# Patient Record
Sex: Male | Born: 1960 | Race: White | Hispanic: No | Marital: Married | Smoking: Never smoker
Health system: Southern US, Community
[De-identification: ages and names within clinical notes are randomized; demographics above are authoritative.]

## PROBLEM LIST (undated history)

## (undated) HISTORY — PX: BACK SURGERY: SHX140

---

## 2018-08-11 ENCOUNTER — Institutional Professional Consult (permissible substitution): Payer: Self-pay | Admitting: Pulmonary Disease

## 2018-08-31 ENCOUNTER — Ambulatory Visit (INDEPENDENT_AMBULATORY_CARE_PROVIDER_SITE_OTHER): Payer: PRIVATE HEALTH INSURANCE | Admitting: Pulmonary Disease

## 2018-08-31 ENCOUNTER — Encounter: Payer: Self-pay | Admitting: Pulmonary Disease

## 2018-08-31 VITALS — BP 120/72 | HR 71 | Ht 72.0 in | Wt 165.0 lb

## 2018-08-31 DIAGNOSIS — G4733 Obstructive sleep apnea (adult) (pediatric): Secondary | ICD-10-CM

## 2018-08-31 NOTE — Patient Instructions (Signed)
Schedule home sleep study.   

## 2018-08-31 NOTE — Assessment & Plan Note (Signed)
Given excessive daytime somnolence, narrow pharyngeal exam, witnessed apneas & loud snoring, obstructive sleep apnea is very likely & an overnight polysomnogram will be scheduled as a home study. The pathophysiology of obstructive sleep apnea , it's cardiovascular consequences & modes of treatment including CPAP were discused with the patient in detail & they evidenced understanding.  Pretest probability is high.  Red flags include witnessed apneas loud snoring, weight gain.  Certainly quality of his sleep appears poor although quantity may be sufficient for him since he is required about 6 hours all his life.  He does not seem to have history suggestive restless leg syndrome.  We will consider treating for AHI more than 5/hour

## 2018-08-31 NOTE — Progress Notes (Signed)
Subjective:    Patient ID: Larry Austin, male    DOB: 07/22/1961, 57 y.o.   MRN: [id number]  HPI  Chief Complaint  Patient presents with  . Sleep Consult    Per patient, he has issues with staying asleep at night. He will fall asleep and stay asleep for 3-4 hours but will wake up and stay up. Had a SS 14-15 years ago, normal results    57 year old man presents for evaluation of sleep disordered breathing. His main complaint is that he feels always tired.  He he admits that he has never slept well in 35 years and he is okay with 4 to 6 hours of sleep.  Wife has noted increased snoring and occasionally witnessed apneas.  Sometimes he is woken himself up by snoring.  But his main complaint again is that he is always tired.  Epworth sleepiness score is 21 and he reports sleepiness while sitting and reading, watching TV, sitting inactive in a public place or as a passenger in a car. Bedtime is between 10 and 11 PM, sleep latency is a few minutes, sleeps on his left side with one pillow related to left shoulder issues, reports 2-3 nocturnal awakenings especially in the early morning, he seems to sleep well for about 4 hours before he has these awakenings, nocturia x1, he is out of bed by 5:15 AM feeling rested initially but then starts feeling tired again in a couple of hours, no dryness of mouth or morning headaches.  He feels that even a 15 to 20-minute nap is energizing.  He is unable to get this on a weekday but on weekends he is able to get 1-2 naps.  There is no history suggestive of cataplexy, sleep paralysis or parasomnias  He had a sleep study about 15 years ago when he lived in [place] and was told that he does not have sleep apnea.  He was given Ambien initially but had a huge hangover effect.  He was then tried on Sonata which did not help this issues. He has gained about 20 pounds over the past few years to his current weight of 240 pounds  He denies creepy crawly sensations on his  legs, his legs do not wake him up from sleep   No past medical history on file. Past surgical history-tonsillectomy  Social-he drinks an occasional beer, no history of smoking or drug abuse, he lives with his spouse, he works as a Nurse, learning disabilityposition engineer for [employer] and spends extended amounts of time on the computer  No family history of OSA or cancer    No past medical history on file.   Review of Systems  Constitutional: Negative for fever and unexpected weight change.  HENT: Negative for congestion, dental problem, ear pain, nosebleeds, postnasal drip, rhinorrhea, sinus pressure, sneezing, sore throat and trouble swallowing.   Eyes: Negative for redness and itching.  Respiratory: Negative for cough, chest tightness, shortness of breath and wheezing.   Cardiovascular: Negative for palpitations and leg swelling.  Gastrointestinal: Negative for nausea and vomiting.  Genitourinary: Negative for dysuria.  Musculoskeletal: Negative for joint swelling.  Skin: Negative for rash.  Allergic/Immunologic: Negative.  Negative for environmental allergies, food allergies and immunocompromised state.  Neurological: Negative for headaches.  Hematological: Does not bruise/bleed easily.  Psychiatric/Behavioral: Negative for dysphoric mood. The patient is not nervous/anxious.        Objective:   Physical Exam  Gen. Pleasant, obese, in no distress, normal affect ENT -  underbite, no  post nasal drip, class 2-3 airway Neck: No JVD, no thyromegaly, no carotid bruits Lungs: no use of accessory muscles, no dullness to percussion, decreased without rales or rhonchi  Cardiovascular: Rhythm regular, heart sounds  normal, no murmurs or gallops, no peripheral edema Abdomen: soft and non-tender, no hepatosplenomegaly, BS normal. Musculoskeletal: No deformities, no cyanosis or clubbing Neuro:  alert, non focal, no tremors       Assessment & Plan:

## 2018-10-18 DIAGNOSIS — G4733 Obstructive sleep apnea (adult) (pediatric): Secondary | ICD-10-CM | POA: Diagnosis not present

## 2018-10-29 DIAGNOSIS — G4733 Obstructive sleep apnea (adult) (pediatric): Secondary | ICD-10-CM | POA: Diagnosis not present

## 2018-11-03 ENCOUNTER — Telehealth: Payer: Self-pay | Admitting: Pulmonary Disease

## 2018-11-03 DIAGNOSIS — G4733 Obstructive sleep apnea (adult) (pediatric): Secondary | ICD-10-CM

## 2018-11-03 NOTE — Telephone Encounter (Signed)
Per RA, HST showed moderate OSA with 16 events per hour. Recommends RX of autocpap 5-15cm, mask of choice. OV with NP in 6 weeks.

## 2018-11-05 NOTE — Telephone Encounter (Signed)
Patient returning phone call.  Patient phone number is [phone].

## 2018-11-05 NOTE — Telephone Encounter (Signed)
Called and spoke with patient regarding results.  Informed the patient of results and recommendations today. Placed order for new auto cpap machine 5-15cm, mask of choice and supplies Scheduled f/u appt with EW on 01/03/2019 at 9am Pt verbalized understanding and denied any questions or concerns at this time.  Nothing further needed.

## 2018-11-05 NOTE — Telephone Encounter (Signed)
Attempted to call back pt, no answer, no vm available. X1

## 2018-11-05 NOTE — Telephone Encounter (Signed)
Pt returning call requesting results CB# [phone]//kob

## 2018-12-13 ENCOUNTER — Telehealth: Payer: Self-pay | Admitting: Pulmonary Disease

## 2018-12-13 DIAGNOSIS — G4733 Obstructive sleep apnea (adult) (pediatric): Secondary | ICD-10-CM

## 2018-12-13 NOTE — Telephone Encounter (Signed)
I am fine signing this order.  Please go ahead and place it.  [name]

## 2018-12-13 NOTE — Telephone Encounter (Signed)
Spoke to Larry Austin.  Larry Austin stated he spoke to [employer] and they needed an order signed from RA for a new mask.  With RA being in the hospital, [name] could I please send a new order for a mask?  Please advise.

## 2018-12-13 NOTE — Telephone Encounter (Signed)
Spoke with pt.  Sent DME order in to [employer] for mask of choice.  Nothing further is needed.

## 2018-12-31 ENCOUNTER — Telehealth: Payer: Self-pay

## 2018-12-31 NOTE — Telephone Encounter (Signed)
Called [employer] to set pt up in airview.  The compliance person could not find pt in their system.  Was going to have someone else look and connect the airview with our doctors so we would have access to the download.

## 2018-12-31 NOTE — Progress Notes (Addendum)
Virtual Visit via Telephone Note  I connected with Larry Austin on 12/31/18 at  9:00 AM EDT by telephone and verified that I am speaking with the correct person using two identifiers.   I discussed the limitations, risks, security and privacy concerns of performing an evaluation and management service by telephone and the availability of in person appointments. I also discussed with the patient that there may be a patient responsible charge related to this service. The patient expressed understanding and agreed to proceed.   History of Present Illness: 58 year old male, never smoked. PMH significant for OSA. Patient of Dr. Vassie Loll, seen for initial consult on 08/31/18.  HST on 10/18/18 showed moderate obstructive sleep apnea, AHI 16/hr. Started on auto cpap 5-15cm H20. DME company is [employer].   01/03/2019 Called patient today for 6-8 week follow-up OSA. New CPAP start in February. He has been using his CPAP for about a month and a half. Reports less snoring, however, he still does not feel rested that next day. Typically goes to bed around 10-10:30 and wakes up around 3am. States that he has never been the type to get 8 hours of sleep. Occasionally reports that his pressure feels to much. Unable to get download from [facility]), patient reports AHI has been around 3. Changed mask size from medium to large full face mask and reports that it is fitting better.   Airview download 12/04/18-01/02/19: Usage- 30/30 days, 93% > 4 hours Average use- 6 hours 15 mins Pressure - 95% 10.9cm H20 Min airleaks  AHI 2.5   Observations/Objective:  - No shortness of breath, wheezing or cough  Assessment and Plan:  OSA - 100% compliance with CPAP - Patient reports less snoring, continues to have daytime fatigue  - Download showed pressure 95% - 10.9 cm H20; AHI 2.5 - Change auto setting 5-12 cm H20  Disrupted sleep - Try melatonin 3-5mg    Follow Up Instructions:  - Follow up with Dr. Vassie Loll in  July/August    I discussed the assessment and treatment plan with the patient. The patient was provided an opportunity to ask questions and all were answered. The patient agreed with the plan and demonstrated an understanding of the instructions.   The patient was advised to call back or seek an in-person evaluation if the symptoms worsen or if the condition fails to improve as anticipated.  I provided 18 minutes of non-face-to-face time during this encounter.   Glenford Bayley, NP

## 2019-01-03 ENCOUNTER — Ambulatory Visit (INDEPENDENT_AMBULATORY_CARE_PROVIDER_SITE_OTHER): Payer: PRIVATE HEALTH INSURANCE | Admitting: Primary Care

## 2019-01-03 ENCOUNTER — Ambulatory Visit: Payer: PRIVATE HEALTH INSURANCE | Admitting: Primary Care

## 2019-01-03 ENCOUNTER — Encounter: Payer: Self-pay | Admitting: Primary Care

## 2019-01-03 ENCOUNTER — Other Ambulatory Visit: Payer: Self-pay

## 2019-01-03 DIAGNOSIS — G4733 Obstructive sleep apnea (adult) (pediatric): Secondary | ICD-10-CM

## 2019-01-03 NOTE — Addendum Note (Signed)
Addended by: Earvin Hansen on: 01/03/2019 11:14 AM   Modules accepted: Orders

## 2019-01-03 NOTE — Patient Instructions (Addendum)
Continue to wear CPAP every night for goal 4-6 hours   Awaiting download from [employer] to see if we need to adjust pressure setting  Try 3-5mg  Melatonin 1 hour prior to bedtime   Follow up with Dr. Vassie LollAlva in July/August    CPAP and BPAP Information CPAP and BPAP are methods of helping a person breathe with the use of air pressure. CPAP stands for "continuous positive airway pressure." BPAP stands for "bi-level positive airway pressure." In both methods, air is blown through your nose or mouth and into your air passages to help you breathe well. CPAP and BPAP use different amounts of pressure to blow air. With CPAP, the amount of pressure stays the same while you breathe in and out. With BPAP, the amount of pressure is increased when you breathe in (inhale) so that you can take larger breaths. Your health care provider will recommend whether CPAP or BPAP would be more helpful for you. Why are CPAP and BPAP treatments used? CPAP or BPAP can be helpful if you have:  Sleep apnea.  Chronic obstructive pulmonary disease (COPD).  Heart failure.  Medical conditions that weaken the muscles of the chest including muscular dystrophy, or neurological diseases such as amyotrophic lateral sclerosis (ALS).  Other problems that cause breathing to be weak, abnormal, or difficult. CPAP is most commonly used for obstructive sleep apnea (OSA) to keep the airways from collapsing when the muscles relax during sleep. How is CPAP or BPAP administered? Both CPAP and BPAP are provided by a small machine with a flexible plastic tube that attaches to a plastic mask. You wear the mask. Air is blown through the mask into your nose or mouth. The amount of pressure that is used to blow the air can be adjusted on the machine. Your health care provider will determine the pressure setting that should be used based on your individual needs. When should CPAP or BPAP be used? In most cases, the mask only needs to be worn during  sleep. Generally, the mask needs to be worn throughout the night and during any daytime naps. People with certain medical conditions may also need to wear the mask at other times when they are awake. Follow instructions from your health care provider about when to use the machine. What are some tips for using the mask?   Because the mask needs to be snug, some people feel trapped or closed-in (claustrophobic) when first using the mask. If you feel this way, you may need to get used to the mask. One way to do this is by holding the mask loosely over your nose or mouth and then gradually applying the mask more snugly. You can also gradually increase the amount of time that you use the mask.  Masks are available in various types and sizes. Some fit over your mouth and nose while others fit over just your nose. If your mask does not fit well, talk with your health care provider about getting a different one.  If you are using a mask that fits over your nose and you tend to breathe through your mouth, a chin strap may be applied to help keep your mouth closed.  The CPAP and BPAP machines have alarms that may sound if the mask comes off or develops a leak.  If you have trouble with the mask, it is very important that you talk with your health care provider about finding a way to make the mask easier to tolerate. Do not stop using  the mask. Stopping the use of the mask could have a negative impact on your health. What are some tips for using the machine?  Place your CPAP or BPAP machine on a secure table or stand near an electrical outlet.  Know where the on/off switch is located on the machine.  Follow instructions from your health care provider about how to set the pressure on your machine and when you should use it.  Do not eat or drink while the CPAP or BPAP machine is on. Food or fluids could get pushed into your lungs by the pressure of the CPAP or BPAP.  Do not smoke. Tobacco smoke residue can  damage the machine.  For home use, CPAP and BPAP machines can be rented or purchased through home health care companies. Many different brands of machines are available. Renting a machine before purchasing may help you find out which particular machine works well for you.  Keep the CPAP or BPAP machine and attachments clean. Ask your health care provider for specific instructions. Get help right away if:  You have redness or open areas around your nose or mouth where the mask fits.  You have trouble using the CPAP or BPAP machine.  You cannot tolerate wearing the CPAP or BPAP mask.  You have pain, discomfort, and bloating in your abdomen. Summary  CPAP and BPAP are methods of helping a person breathe with the use of air pressure.  Both CPAP and BPAP are provided by a small machine with a flexible plastic tube that attaches to a plastic mask.  If you have trouble with the mask, it is very important that you talk with your health care provider about finding a way to make the mask easier to tolerate. This information is not intended to replace advice given to you by your health care provider. Make sure you discuss any questions you have with your health care provider. Document Released: 05/30/2004 Document Revised: 05/04/2018 Document Reviewed: 07/21/2016 Elsevier Interactive Patient Education  2019 [employer].

## 2019-07-19 ENCOUNTER — Other Ambulatory Visit: Payer: Self-pay

## 2019-07-19 DIAGNOSIS — Z20822 Contact with and (suspected) exposure to covid-19: Secondary | ICD-10-CM

## 2019-07-20 LAB — NOVEL CORONAVIRUS, NAA: SARS-CoV-2, NAA: NOT DETECTED

## 2019-09-06 ENCOUNTER — Other Ambulatory Visit: Payer: Self-pay | Admitting: Family Medicine

## 2019-09-06 DIAGNOSIS — M5430 Sciatica, unspecified side: Secondary | ICD-10-CM

## 2019-09-13 ENCOUNTER — Ambulatory Visit
Admission: RE | Admit: 2019-09-13 | Discharge: 2019-09-13 | Disposition: A | Discharge status: Home / Self Care | Payer: 59 | Source: Ambulatory Visit | Attending: Family Medicine | Admitting: Family Medicine

## 2019-09-13 DIAGNOSIS — M5430 Sciatica, unspecified side: Secondary | ICD-10-CM

## 2019-10-05 ENCOUNTER — Other Ambulatory Visit: Payer: PRIVATE HEALTH INSURANCE

## 2020-06-18 ENCOUNTER — Other Ambulatory Visit: Payer: Self-pay

## 2020-06-18 ENCOUNTER — Other Ambulatory Visit: Payer: 59

## 2020-06-18 DIAGNOSIS — Z20822 Contact with and (suspected) exposure to covid-19: Secondary | ICD-10-CM

## 2020-06-19 LAB — SARS-COV-2, NAA 2 DAY TAT

## 2020-06-19 LAB — NOVEL CORONAVIRUS, NAA: SARS-CoV-2, NAA: NOT DETECTED

## 2020-07-26 ENCOUNTER — Ambulatory Visit: Payer: 59 | Attending: Internal Medicine

## 2020-07-26 DIAGNOSIS — Z23 Encounter for immunization: Secondary | ICD-10-CM

## 2020-07-26 NOTE — Progress Notes (Signed)
   Covid-19 Vaccination Clinic  Name:  Tamika Nou    MRN: [id number] DOB: [birth year]  07/26/2020  Mr. Blank was observed post Covid-19 immunization for 15 minutes without incident. He was provided with Vaccine Information Sheet and instruction to access the V-Safe system.   Mr. Brosseau was instructed to call 911 with any severe reactions post vaccine: Marland Kitchen Difficulty breathing  . Swelling of face and throat  . A fast heartbeat  . A bad rash all over body  . Dizziness and weakness

## 2021-05-18 ENCOUNTER — Other Ambulatory Visit: Payer: Self-pay

## 2021-05-18 ENCOUNTER — Emergency Department
Admission: EM | Admit: 2021-05-18 | Discharge: 2021-05-18 | Disposition: A | Discharge status: Home / Self Care | Payer: BC Managed Care – PPO | Attending: Student in an Organized Health Care Education/Training Program | Admitting: Student in an Organized Health Care Education/Training Program

## 2021-05-18 ENCOUNTER — Emergency Department: Payer: BC Managed Care – PPO

## 2021-05-18 DIAGNOSIS — S91312A Laceration without foreign body, left foot, initial encounter: Secondary | ICD-10-CM | POA: Diagnosis not present

## 2021-05-18 DIAGNOSIS — W274XXA Contact with kitchen utensil, initial encounter: Secondary | ICD-10-CM | POA: Insufficient documentation

## 2021-05-18 DIAGNOSIS — Z23 Encounter for immunization: Secondary | ICD-10-CM | POA: Diagnosis not present

## 2021-05-18 DIAGNOSIS — Y92000 Kitchen of unspecified non-institutional (private) residence as  the place of occurrence of the external cause: Secondary | ICD-10-CM | POA: Insufficient documentation

## 2021-05-18 DIAGNOSIS — S99922A Unspecified injury of left foot, initial encounter: Secondary | ICD-10-CM | POA: Diagnosis present

## 2021-05-18 MED ORDER — OXYCODONE-ACETAMINOPHEN 5-325 MG PO TABS
1.0000 | ORAL_TABLET | Freq: Once | ORAL | Status: AC
  Administered 2021-05-18: 1 via ORAL
  Filled 2021-05-18: qty 1

## 2021-05-18 MED ORDER — CEPHALEXIN 500 MG PO CAPS: 500.0000 mg | ORAL_CAPSULE | Freq: Three times a day (TID) | ORAL | 0 refills | Status: DC

## 2021-05-18 MED ORDER — TETANUS-DIPHTH-ACELL PERTUSSIS 5-2.5-18.5 LF-MCG/0.5 IM SUSY
0.5000 mL | PREFILLED_SYRINGE | Freq: Once | INTRAMUSCULAR | Status: AC
  Administered 2021-05-18: 0.5 mL via INTRAMUSCULAR
  Filled 2021-05-18: qty 0.5

## 2021-05-18 MED ORDER — OXYCODONE-ACETAMINOPHEN 5-325 MG PO TABS: 1.0000 | ORAL_TABLET | Freq: Four times a day (QID) | ORAL | 0 refills | Status: AC | PRN

## 2021-05-18 MED ORDER — CEPHALEXIN 500 MG PO CAPS: 500.0000 mg | ORAL_CAPSULE | Freq: Three times a day (TID) | ORAL | 0 refills | Status: AC

## 2021-05-18 MED ORDER — ONDANSETRON 4 MG PO TBDP
4.0000 mg | ORAL_TABLET | Freq: Once | ORAL | Status: AC
  Administered 2021-05-18: 4 mg via ORAL
  Filled 2021-05-18: qty 1

## 2021-05-18 MED ORDER — LIDOCAINE HCL 1 % IJ SOLN
5.0000 mL | Freq: Once | INTRAMUSCULAR | Status: AC
  Administered 2021-05-18: 5 mL
  Filled 2021-05-18: qty 10

## 2021-05-18 MED ORDER — ONDANSETRON 4 MG PO TBDP: 4.0000 mg | ORAL_TABLET | Freq: Three times a day (TID) | ORAL | 0 refills | Status: AC | PRN

## 2021-05-18 NOTE — ED Provider Notes (Signed)
[facility]-EMERGENCY DEPARTMENT  ____________________________________________  Time seen: Approximately 10:00 PM  I have reviewed the triage vital signs and the nursing notes.   HISTORY  Chief Complaint Laceration   Historian Patient     HPI Larry Austin is a 60 y.o. male presents to the emergency department with a 3 cm laceration along the dorsal aspect of the left foot.  Patient reports that he was standing at the counter in his kitchen when he dropped a bread knife.  Patient reports that he has been unable to extend left great toe since injury occurred.  Patient cannot recall his last tetanus shot.  No other alleviating measures have been attempted.   History reviewed. No pertinent past medical history.   Immunizations up to date:  Yes.     History reviewed. No pertinent past medical history.  Patient Active Problem List   Diagnosis Date Noted   OSA (obstructive sleep apnea) 08/31/2018    Past Surgical History:  Procedure Laterality Date   BACK SURGERY      Prior to Admission medications   Medication Sig Start Date End Date Taking? Authorizing Provider  ondansetron (ZOFRAN ODT) 4 MG disintegrating tablet Take 1 tablet (4 mg total) by mouth every 8 (eight) hours as needed for up to 5 days. 05/18/21 05/23/21 Yes Pia Mau M, PA-C  oxyCODONE-acetaminophen (PERCOCET/ROXICET) 5-325 MG tablet Take 1 tablet by mouth every 6 (six) hours as needed for up to 3 days. 05/18/21 05/21/21 Yes Pia Mau M, PA-C  cephALEXin (KEFLEX) 500 MG capsule Take 1 capsule (500 mg total) by mouth 3 (three) times daily for 7 days. 05/18/21 05/25/21  Orvil Feil, PA-C    Allergies Patient has no known allergies.  No family history on file.  Social History Social History   Tobacco Use   Smoking status: Never   Smokeless tobacco: Never  Substance Use Topics   Alcohol use: Yes   Drug use: Never     Review of Systems  Constitutional: No fever/chills Eyes:  No discharge ENT: No  upper respiratory complaints. Respiratory: no cough. No SOB/ use of accessory muscles to breath Gastrointestinal:   No nausea, no vomiting.  No diarrhea.  No constipation. Musculoskeletal: Patient has left great toe pain.  Skin: Negative for rash, abrasions, lacerations, ecchymosis.    ____________________________________________   PHYSICAL EXAM:  VITAL SIGNS: ED Triage Vitals  Enc Vitals Group     BP 05/18/21 1826 139/88     Pulse Rate 05/18/21 1826 69     Resp 05/18/21 1826 18     Temp 05/18/21 1826 98.6 F (37 C)     Temp Source 05/18/21 1826 Oral     SpO2 05/18/21 1826 99 %     Weight 05/18/21 1827 235 lb (106.6 kg)     Height 05/18/21 1827 6' (1.829 m)     Head Circumference --      Peak Flow --      Pain Score 05/18/21 1827 8     Pain Loc --      Pain Edu? --      Excl. in GC? --      Constitutional: Alert and oriented. Well appearing and in no acute distress. Eyes: Conjunctivae are normal. PERRL. EOMI. Head: Atraumatic. ENT: Cardiovascular: Normal rate, regular rhythm. Normal S1 and S2.  Good peripheral circulation. Respiratory: Normal respiratory effort without tachypnea or retractions. Lungs CTAB. Good air entry to the bases with no decreased or absent breath sounds Gastrointestinal: Bowel sounds x 4  quadrants. Soft and nontender to palpation. No guarding or rigidity. No distention. Musculoskeletal: Patient has flexion lag at left great toe and has difficulty extending left great toe.  There is a 3 cm laceration along the dorsal aspect of the left foot which is deep to adipose tissue.  There is no tendon exposure.  Palpable dorsalis pedis pulse bilaterally and symmetrically. Capillary refill less than 2 seconds on the left.  Neurologic:  Normal for age. No gross focal neurologic deficits are appreciated.  Skin:  Skin is warm, dry and intact. No rash noted. Psychiatric: Mood and affect are normal for age. Speech and behavior are normal.    ____________________________________________   LABS (all labs ordered are listed, but only abnormal results are displayed)  Labs Reviewed - No data to display ____________________________________________  EKG   ____________________________________________  RADIOLOGY Geraldo Pitter, personally viewed and evaluated these images (plain radiographs) as part of my medical decision making, as well as reviewing the written report by the radiologist.  DG Foot Complete Left  Result Date: 05/18/2021 CLINICAL DATA:  Laceration left foot with knife. EXAM: LEFT FOOT - COMPLETE 3+ VIEW COMPARISON:  None. FINDINGS: Plantar calcaneal spur. No acute bony abnormality. Specifically, no fracture, subluxation, or dislocation. No radiopaque foreign body or soft tissue gas. IMPRESSION: No acute bony abnormality. Electronically Signed   By: Charlett Nose M.D.   On: 05/18/2021 20:24    ____________________________________________    PROCEDURES  Procedure(s) performed:     Marland KitchenMarland KitchenLaceration Repair  Date/Time: 05/18/2021 10:03 PM Performed by: Orvil Feil, PA-C Authorized by: Orvil Feil, PA-C   Consent:    Consent obtained:  Verbal   Risks discussed:  Infection and pain Universal protocol:    Procedure explained and questions answered to patient or proxy's satisfaction: yes     Patient identity confirmed:  Verbally with patient Anesthesia:    Anesthesia method:  Local infiltration   Local anesthetic:  Lidocaine 1% w/o epi Laceration details:    Location:  Foot   Foot location:  Top of L foot   Length (cm):  3   Depth (mm):  3 Pre-procedure details:    Preparation:  Patient was prepped and draped in usual sterile fashion Exploration:    Limited defect created (wound extended): no     Contaminated: no   Treatment:    Area cleansed with:  Povidone-iodine   Irrigation solution:  Sterile saline   Irrigation volume:  500   Irrigation method:  Tap   Visualized foreign bodies/material  removed: no     Undermining:  None   Scar revision: no   Skin repair:    Repair method:  Sutures   Suture size:  4-0   Suture technique:  Running locked   Number of sutures:  7 Approximation:    Approximation:  Close Repair type:    Repair type:  Intermediate Post-procedure details:    Dressing:  Non-adherent dressing     Medications  lidocaine (XYLOCAINE) 1 % (with pres) injection 5 mL (5 mLs Infiltration Given 05/18/21 2133)  oxyCODONE-acetaminophen (PERCOCET/ROXICET) 5-325 MG per tablet 1 tablet (1 tablet Oral Given 05/18/21 2139)  ondansetron (ZOFRAN-ODT) disintegrating tablet 4 mg (4 mg Oral Given 05/18/21 2139)  Tdap (BOOSTRIX) injection 0.5 mL (0.5 mLs Intramuscular Given 05/18/21 2157)     ____________________________________________   INITIAL IMPRESSION / ASSESSMENT AND PLAN / ED COURSE  Pertinent labs & imaging results that were available during my care of the patient were reviewed by  me and considered in my medical decision making (see chart for details).      Assessment and plan Laceration 60 year old male presents to the emergency department with a 3 cm laceration along the dorsal aspect of the left foot.  No bony abnormality was visualized on x-ray.  I reached out to podiatrist on-call, Dr. Alberteen Spindle.  Very much appreciate time and consult.  Explained to podiatry that patient was having difficulty extending left great toe without tendon exposure.  Dr. Alberteen Spindle recommended irrigation with laceration repair and immobilization in cam boot with follow-up as an outpatient.  Patient's tetanus status was updated in the emergency department.  Laceration repair occurred without complication and patient was discharged with Keflex.  Also prescribed patient Percocet for pain and recommended MiraLAX if Percocet is too constipating.  Return precautions were given to return with redness or streaking surrounding the wound site.  All patient questions were  answered.    ____________________________________________  FINAL CLINICAL IMPRESSION(S) / ED DIAGNOSES  Final diagnoses:  Laceration of left foot, initial encounter      NEW MEDICATIONS STARTED DURING THIS VISIT:  ED Discharge Orders          Ordered    cephALEXin (KEFLEX) 500 MG capsule  3 times daily,   Status:  Discontinued        05/18/21 2141    cephALEXin (KEFLEX) 500 MG capsule  3 times daily        05/18/21 2142    oxyCODONE-acetaminophen (PERCOCET/ROXICET) 5-325 MG tablet  Every 6 hours PRN        05/18/21 2142    ondansetron (ZOFRAN ODT) 4 MG disintegrating tablet  Every 8 hours PRN        05/18/21 2142                This chart was dictated using voice recognition software/Dragon. Despite best efforts to proofread, errors can occur which can change the meaning. Any change was purely unintentional.     Gasper Lloyd 05/18/21 2206    Willy Eddy, MD 05/19/21 1459

## 2021-05-18 NOTE — ED Triage Notes (Signed)
Pt states he dropped a bread knife on his L foot when making a sandwich- pt has about a 1 inch lac to the top of his left foot, bleeding controlled at this time- lac wrapped with gauze

## 2021-05-18 NOTE — Discharge Instructions (Addendum)
Schedule an appointment for suture removal in seven days.  Take Keflex three times daily for the next seven days.  You can take Percocet for pain. You can take MiraLAX for constipation if constipation occurs with Percocet. You can take daily stool probiotic if you have diarrhea with antibiotic. Please make a follow-up appointment with Dr. Alberteen Spindle

## 2021-05-29 ENCOUNTER — Other Ambulatory Visit (HOSPITAL_COMMUNITY): Payer: Self-pay | Admitting: Podiatry

## 2021-05-29 ENCOUNTER — Ambulatory Visit
Admission: RE | Admit: 2021-05-29 | Discharge: 2021-05-29 | Disposition: A | Discharge status: Home / Self Care | Payer: BC Managed Care – PPO | Source: Ambulatory Visit | Attending: Podiatry | Admitting: Podiatry

## 2021-05-29 ENCOUNTER — Other Ambulatory Visit: Payer: Self-pay

## 2021-05-29 ENCOUNTER — Other Ambulatory Visit: Payer: Self-pay | Admitting: Podiatry

## 2021-05-29 DIAGNOSIS — S91312A Laceration without foreign body, left foot, initial encounter: Secondary | ICD-10-CM

## 2021-05-30 ENCOUNTER — Other Ambulatory Visit: Payer: Self-pay | Admitting: Podiatry

## 2021-05-31 ENCOUNTER — Other Ambulatory Visit: Payer: Self-pay

## 2021-05-31 ENCOUNTER — Ambulatory Visit: Payer: BC Managed Care – PPO | Admitting: Certified Registered Nurse Anesthetist

## 2021-05-31 ENCOUNTER — Encounter
Admission: RE | Disposition: A | Discharge status: Home / Self Care | Payer: Self-pay | Source: Home / Self Care | Attending: Podiatry

## 2021-05-31 ENCOUNTER — Encounter: Payer: Self-pay | Admitting: Podiatry

## 2021-05-31 ENCOUNTER — Ambulatory Visit
Admission: RE | Admit: 2021-05-31 | Discharge: 2021-05-31 | Disposition: A | Discharge status: Home / Self Care | Payer: BC Managed Care – PPO | Attending: Podiatry | Admitting: Podiatry

## 2021-05-31 DIAGNOSIS — W260XXA Contact with knife, initial encounter: Secondary | ICD-10-CM | POA: Diagnosis not present

## 2021-05-31 DIAGNOSIS — S96122A Laceration of muscle and tendon of long extensor muscle of toe at ankle and foot level, left foot, initial encounter: Secondary | ICD-10-CM | POA: Insufficient documentation

## 2021-05-31 HISTORY — PX: REPAIR EXTENSOR TENDON: SHX5382

## 2021-05-31 SURGERY — REPAIR, TENDON, EXTENSOR
Anesthesia: General | Site: Foot | Laterality: Left

## 2021-05-31 MED ORDER — ONDANSETRON HCL 4 MG/2ML IJ SOLN
INTRAMUSCULAR | Status: AC
  Filled 2021-05-31: qty 2

## 2021-05-31 MED ORDER — NEOMYCIN-POLYMYXIN B GU 40-200000 IR SOLN
Status: DC | PRN
  Administered 2021-05-31: 2 mL

## 2021-05-31 MED ORDER — BUPIVACAINE-EPINEPHRINE (PF) 0.5% -1:200000 IJ SOLN
INTRAMUSCULAR | Status: AC
  Filled 2021-05-31: qty 30

## 2021-05-31 MED ORDER — FENTANYL CITRATE (PF) 100 MCG/2ML IJ SOLN
INTRAMUSCULAR | Status: DC | PRN
  Administered 2021-05-31 (×2): 25 ug via INTRAVENOUS
  Administered 2021-05-31: 50 ug via INTRAVENOUS

## 2021-05-31 MED ORDER — DEXAMETHASONE SODIUM PHOSPHATE 10 MG/ML IJ SOLN
INTRAMUSCULAR | Status: AC
  Filled 2021-05-31: qty 1

## 2021-05-31 MED ORDER — METOCLOPRAMIDE HCL 5 MG/ML IJ SOLN: 5.0000 mg | Freq: Three times a day (TID) | INTRAMUSCULAR | Status: DC | PRN

## 2021-05-31 MED ORDER — ORAL CARE MOUTH RINSE: 15.0000 mL | Freq: Once | OROMUCOSAL | Status: AC

## 2021-05-31 MED ORDER — LIDOCAINE HCL (PF) 2 % IJ SOLN
INTRAMUSCULAR | Status: AC
  Filled 2021-05-31: qty 5

## 2021-05-31 MED ORDER — PROMETHAZINE HCL 25 MG/ML IJ SOLN: 6.2500 mg | INTRAMUSCULAR | Status: DC | PRN

## 2021-05-31 MED ORDER — 0.9 % SODIUM CHLORIDE (POUR BTL) OPTIME
TOPICAL | Status: DC | PRN
  Administered 2021-05-31: 200 mL

## 2021-05-31 MED ORDER — POVIDONE-IODINE 7.5 % EX SOLN
Freq: Once | CUTANEOUS | Status: DC
  Filled 2021-05-31: qty 118

## 2021-05-31 MED ORDER — MIDAZOLAM HCL 2 MG/2ML IJ SOLN
INTRAMUSCULAR | Status: AC
  Filled 2021-05-31: qty 2

## 2021-05-31 MED ORDER — ACETAMINOPHEN 10 MG/ML IV SOLN
INTRAVENOUS | Status: DC | PRN
  Administered 2021-05-31: 1000 mg via INTRAVENOUS

## 2021-05-31 MED ORDER — CEFAZOLIN SODIUM-DEXTROSE 2-4 GM/100ML-% IV SOLN
INTRAVENOUS | Status: AC
  Filled 2021-05-31: qty 100

## 2021-05-31 MED ORDER — MIDAZOLAM HCL 2 MG/2ML IJ SOLN
INTRAMUSCULAR | Status: DC | PRN
  Administered 2021-05-31: 2 mg via INTRAVENOUS

## 2021-05-31 MED ORDER — CEFAZOLIN SODIUM-DEXTROSE 2-4 GM/100ML-% IV SOLN
2.0000 g | INTRAVENOUS | Status: AC
  Administered 2021-05-31: 2 g via INTRAVENOUS

## 2021-05-31 MED ORDER — FENTANYL CITRATE (PF) 100 MCG/2ML IJ SOLN: 25.0000 ug | INTRAMUSCULAR | Status: DC | PRN

## 2021-05-31 MED ORDER — BUPIVACAINE LIPOSOME 1.3 % IJ SUSP
INTRAMUSCULAR | Status: AC
  Filled 2021-05-31: qty 20

## 2021-05-31 MED ORDER — SODIUM CHLORIDE (PF) 0.9 % IJ SOLN
INTRAMUSCULAR | Status: AC
  Filled 2021-05-31: qty 50

## 2021-05-31 MED ORDER — CHLORHEXIDINE GLUCONATE 0.12 % MT SOLN: 15.0000 mL | Freq: Once | OROMUCOSAL | Status: AC

## 2021-05-31 MED ORDER — LIDOCAINE HCL (CARDIAC) PF 100 MG/5ML IV SOSY
PREFILLED_SYRINGE | INTRAVENOUS | Status: DC | PRN
  Administered 2021-05-31: 80 mg via INTRAVENOUS

## 2021-05-31 MED ORDER — LIDOCAINE HCL (PF) 1 % IJ SOLN
INTRAMUSCULAR | Status: AC
  Filled 2021-05-31: qty 30

## 2021-05-31 MED ORDER — OXYCODONE-ACETAMINOPHEN 5-325 MG PO TABS: 1.0000 | ORAL_TABLET | Freq: Four times a day (QID) | ORAL | 0 refills | Status: AC | PRN

## 2021-05-31 MED ORDER — PROPOFOL 10 MG/ML IV BOLUS
INTRAVENOUS | Status: AC
  Filled 2021-05-31: qty 20

## 2021-05-31 MED ORDER — BUPIVACAINE-EPINEPHRINE 0.5% -1:200000 IJ SOLN
INTRAMUSCULAR | Status: DC | PRN
  Administered 2021-05-31: 10 mL

## 2021-05-31 MED ORDER — LACTATED RINGERS IV SOLN: INTRAVENOUS | Status: DC

## 2021-05-31 MED ORDER — FENTANYL CITRATE (PF) 100 MCG/2ML IJ SOLN
INTRAMUSCULAR | Status: AC
  Filled 2021-05-31: qty 2

## 2021-05-31 MED ORDER — ONDANSETRON HCL 4 MG/2ML IJ SOLN: 4.0000 mg | Freq: Four times a day (QID) | INTRAMUSCULAR | Status: DC | PRN

## 2021-05-31 MED ORDER — BUPIVACAINE LIPOSOME 1.3 % IJ SUSP
INTRAMUSCULAR | Status: DC | PRN
  Administered 2021-05-31: 10 mL

## 2021-05-31 MED ORDER — BUPIVACAINE HCL (PF) 0.5 % IJ SOLN
INTRAMUSCULAR | Status: AC
  Filled 2021-05-31: qty 30

## 2021-05-31 MED ORDER — NEOMYCIN-POLYMYXIN B GU 40-200000 IR SOLN
Status: AC
  Filled 2021-05-31: qty 2

## 2021-05-31 MED ORDER — CHLORHEXIDINE GLUCONATE 0.12 % MT SOLN
OROMUCOSAL | Status: AC
  Administered 2021-05-31: 15 mL via OROMUCOSAL
  Filled 2021-05-31: qty 15

## 2021-05-31 MED ORDER — METOCLOPRAMIDE HCL 10 MG PO TABS: 5.0000 mg | ORAL_TABLET | Freq: Three times a day (TID) | ORAL | Status: DC | PRN

## 2021-05-31 MED ORDER — ACETAMINOPHEN 10 MG/ML IV SOLN
INTRAVENOUS | Status: AC
  Filled 2021-05-31: qty 100

## 2021-05-31 MED ORDER — PROPOFOL 10 MG/ML IV BOLUS
INTRAVENOUS | Status: DC | PRN
  Administered 2021-05-31: 200 mg via INTRAVENOUS

## 2021-05-31 MED ORDER — ONDANSETRON HCL 4 MG PO TABS: 4.0000 mg | ORAL_TABLET | Freq: Four times a day (QID) | ORAL | Status: DC | PRN

## 2021-05-31 SURGICAL SUPPLY — 63 items
BLADE SURG 15 STRL LF DISP TIS (BLADE) ×2 IMPLANT
BLADE SURG 15 STRL SS (BLADE) ×4
BLADE SURG MINI STRL (BLADE) ×2 IMPLANT
BNDG CMPR STD VLCR NS LF 5.8X4 (GAUZE/BANDAGES/DRESSINGS) ×2
BNDG CONFORM 2 STRL LF (GAUZE/BANDAGES/DRESSINGS) ×2 IMPLANT
BNDG CONFORM 3 STRL LF (GAUZE/BANDAGES/DRESSINGS) ×2 IMPLANT
BNDG ELASTIC 4X5.8 VLCR NS LF (GAUZE/BANDAGES/DRESSINGS) ×4 IMPLANT
BNDG ESMARK 4X12 TAN STRL LF (GAUZE/BANDAGES/DRESSINGS) ×2 IMPLANT
BNDG GAUZE ELAST 4 BULKY (GAUZE/BANDAGES/DRESSINGS) ×2 IMPLANT
CUFF TOURN SGL QUICK 18X4 (TOURNIQUET CUFF) IMPLANT
CUFF TOURN SGL QUICK 24 (TOURNIQUET CUFF)
CUFF TRNQT CYL 24X4X16.5-23 (TOURNIQUET CUFF) IMPLANT
DRAPE FLUOR MINI C-ARM 54X84 (DRAPES) ×2 IMPLANT
DURAPREP 26ML APPLICATOR (WOUND CARE) ×2 IMPLANT
ELECT REM PT RETURN 9FT ADLT (ELECTROSURGICAL) ×2
ELECTRODE REM PT RTRN 9FT ADLT (ELECTROSURGICAL) ×1 IMPLANT
GAUZE 4X4 16PLY ~~LOC~~+RFID DBL (SPONGE) ×2 IMPLANT
GAUZE SPONGE 4X4 12PLY STRL (GAUZE/BANDAGES/DRESSINGS) ×2 IMPLANT
GAUZE XEROFORM 1X8 LF (GAUZE/BANDAGES/DRESSINGS) ×2 IMPLANT
GLOVE SURG ENC MOIS LTX SZ7.5 (GLOVE) ×2 IMPLANT
GLOVE SURG UNDER LTX SZ8 (GLOVE) ×2 IMPLANT
GOWN STRL REUS W/ TWL XL LVL3 (GOWN DISPOSABLE) ×2 IMPLANT
GOWN STRL REUS W/TWL XL LVL3 (GOWN DISPOSABLE) ×4
HANDLE YANKAUER SUCT BULB TIP (MISCELLANEOUS) ×2 IMPLANT
KIT SUTURE 1.8 Q-FIX DISP (KITS) ×2 IMPLANT
KIT TURNOVER KIT A (KITS) ×2 IMPLANT
LABEL OR SOLS (LABEL) ×2 IMPLANT
MANIFOLD NEPTUNE II (INSTRUMENTS) ×2 IMPLANT
NDL MAYO CATGUT SZ5 (NEEDLE)
NDL SUT 5 .5 CRC TPR PNT MAYO (NEEDLE) IMPLANT
NEEDLE FILTER BLUNT 18X 1/2SAF (NEEDLE) ×1
NEEDLE FILTER BLUNT 18X1 1/2 (NEEDLE) ×1 IMPLANT
NEEDLE HYPO 25X1 1.5 SAFETY (NEEDLE) ×2 IMPLANT
NS IRRIG 500ML POUR BTL (IV SOLUTION) ×2 IMPLANT
PACK EXTREMITY ARMC (MISCELLANEOUS) ×2 IMPLANT
PAD CAST CTTN 4X4 STRL (SOFTGOODS) ×2 IMPLANT
PADDING CAST COTTON 4X4 STRL (SOFTGOODS) ×4
RASP SM TEAR CROSS CUT (RASP) ×2 IMPLANT
RETRIEVER SUT HEWSON (MISCELLANEOUS) ×2 IMPLANT
SOL PREP PVP 2OZ (MISCELLANEOUS) ×2
SOLUTION PREP PVP 2OZ (MISCELLANEOUS) ×1 IMPLANT
SPLINT CAST 1 STEP 5X30 WHT (MISCELLANEOUS) ×2 IMPLANT
SPLINT FAST PLASTER 5X30 (CAST SUPPLIES) ×1
SPLINT PLASTER CAST FAST 5X30 (CAST SUPPLIES) ×1 IMPLANT
SPONGE T-LAP 18X18 ~~LOC~~+RFID (SPONGE) ×2 IMPLANT
STOCKINETTE M/LG 89821 (MISCELLANEOUS) ×2 IMPLANT
STRIP CLOSURE SKIN 1/2X4 (GAUZE/BANDAGES/DRESSINGS) ×2 IMPLANT
SUT ETHILON 3-0 (SUTURE) ×4 IMPLANT
SUT MERSILENE 2.0 SH NDLE (SUTURE) ×4 IMPLANT
SUT MNCRL+ 5-0 VIOLET P-3 (SUTURE) ×1 IMPLANT
SUT MONOCRYL 5-0 (SUTURE) ×1
SUT VIC AB 0 SH 27 (SUTURE) ×2 IMPLANT
SUT VIC AB 2-0 SH 27 (SUTURE) ×4
SUT VIC AB 2-0 SH 27XBRD (SUTURE) ×2 IMPLANT
SUT VIC AB 3-0 SH 27 (SUTURE) ×2
SUT VIC AB 3-0 SH 27X BRD (SUTURE) ×1 IMPLANT
SUT VIC AB 4-0 FS2 27 (SUTURE) ×2 IMPLANT
SUT VICRYL 2-0 SH 8X27 (SUTURE) ×2 IMPLANT
SUT VICRYL AB 3-0 FS1 BRD 27IN (SUTURE) ×2 IMPLANT
SWABSTK COMLB BENZOIN TINCTURE (MISCELLANEOUS) ×2 IMPLANT
SYR 10ML LL (SYRINGE) ×4 IMPLANT
SYR 3ML LL SCALE MARK (SYRINGE) ×2 IMPLANT
WATER STERILE IRR 500ML POUR (IV SOLUTION) ×2 IMPLANT

## 2021-05-31 NOTE — Anesthesia Preprocedure Evaluation (Signed)
Anesthesia Evaluation  Patient identified by MRN, date of birth, ID band Patient awake    Reviewed: Allergy & Precautions, H&P , NPO status , Patient's Chart, lab work & pertinent test results, reviewed documented beta blocker date and time   Airway Mallampati: II  TM Distance: <3 FB Neck ROM: full    Dental  (+) Dental Advidsory Given, Teeth Intact, Caps, Missing   Pulmonary neg shortness of breath, sleep apnea and Continuous Positive Airway Pressure Ventilation , neg COPD, neg recent URI,    Pulmonary exam normal breath sounds clear to auscultation       Cardiovascular Exercise Tolerance: Good negative cardio ROS Normal cardiovascular exam Rhythm:regular Rate:Normal     Neuro/Psych negative neurological ROS  negative psych ROS   GI/Hepatic negative GI ROS, Neg liver ROS,   Endo/Other  negative endocrine ROS  Renal/GU negative Renal ROS  negative genitourinary   Musculoskeletal   Abdominal   Peds  Hematology negative hematology ROS (+)   Anesthesia Other Findings History reviewed. No pertinent past medical history.   Reproductive/Obstetrics negative OB ROS                             Anesthesia Physical Anesthesia Plan  ASA: 2  Anesthesia Plan: General   Post-op Pain Management:    Induction: Intravenous  PONV Risk Score and Plan: 2 and Ondansetron, Dexamethasone, Midazolam and Treatment may vary due to age or medical condition  Airway Management Planned: LMA  Additional Equipment:   Intra-op Plan:   Post-operative Plan: Extubation in OR  Informed Consent: I have reviewed the patients History and Physical, chart, labs and discussed the procedure including the risks, benefits and alternatives for the proposed anesthesia with the patient or authorized representative who has indicated his/her understanding and acceptance.     Dental Advisory Given  Plan Discussed with:  Anesthesiologist, CRNA and Surgeon  Anesthesia Plan Comments:         Anesthesia Quick Evaluation

## 2021-05-31 NOTE — Transfer of Care (Signed)
Immediate Anesthesia Transfer of Care Note  Patient: Larry Austin  Procedure(s) Performed: REPAIR EXTENSOR TENDON (Left: Foot)  Patient Location: PACU  Anesthesia Type:General  Level of Consciousness: awake, alert  and patient cooperative  Airway & Oxygen Therapy: Patient Spontanous Breathing and Patient connected to face mask oxygen  Post-op Assessment: Report given to RN and Post -op Vital signs reviewed and stable  Post vital signs: Reviewed and stable  Last Vitals:  Vitals Value Taken Time  BP 135/86  1847 05/31/2021  Temp 97.5   Pulse 78   Resp 12   SpO2 98     Last Pain:  Vitals:   05/31/21 1521  PainSc: 1          Complications: No notable events documented.

## 2021-05-31 NOTE — Discharge Instructions (Addendum)
[facility]  POST OPERATIVE INSTRUCTIONS FOR DR. FOWLER AND [facility]   Take your medication as prescribed.  Pain medication should be taken only as needed.  Keep the dressing clean, dry and intact.  Keep your foot elevated above the heart level for the first 48 hours.  We have instructed you to be non-weight bearing.  Always wear your post-op shoe when walking.  Always use your crutches if you are to be non-weight bearing.  Do not take a shower. Baths are permissible as long as the foot is kept out of the water.   Every hour you are awake:  Bend your knee 15 times.  Call [facility] ([phone]) if any of the following problems occur: You develop a temperature or fever. The bandage becomes saturated with blood. Medication does not stop your pain. Injury of the foot occurs. Any symptoms of infection including redness, odor, or red streaks running from wound.  AMBULATORY SURGERY  DISCHARGE INSTRUCTIONS   The drugs that you were given will stay in your system until tomorrow so for the next 24 hours you should not:  Drive an automobile Make any legal decisions Drink any alcoholic beverage   You may resume regular meals tomorrow.  Today it is better to start with liquids and gradually work up to solid foods.  You may eat anything you prefer, but it is better to start with liquids, then soup and crackers, and gradually work up to solid foods.   Please notify your doctor immediately if you have any unusual bleeding, trouble breathing, redness and pain at the surgery site, drainage, fever, or pain not relieved by medication.    Additional Instructions:  Please contact your physician with any problems or Same Day Surgery at [phone], Monday through Friday 6 am to 4 pm, or [facility] Main number at [phone]. 

## 2021-05-31 NOTE — H&P (Signed)
HISTORY AND PHYSICAL INTERVAL NOTE:  05/31/2021  4:58 PM  Larry Austin  has presented today for surgery, with the diagnosis of Repair Extension Tendon Repair.  The various methods of treatment have been discussed with the patient.  No guarantees were given.  After consideration of risks, benefits and other options for treatment, the patient has consented to surgery.  I have reviewed the patients' chart and labs.     A history and physical examination was performed in my office.  The patient was reexamined.  There have been no changes to this history and physical examination.  Larry Austin A

## 2021-05-31 NOTE — Anesthesia Procedure Notes (Signed)
Procedure Name: LMA Insertion Date/Time: 05/31/2021 5:22 PM Performed by: Elmarie Mainland, CRNA Pre-anesthesia Checklist: Patient identified, Emergency Drugs available, Suction available and Patient being monitored Patient Re-evaluated:Patient Re-evaluated prior to induction Oxygen Delivery Method: Circle system utilized Preoxygenation: Pre-oxygenation with 100% oxygen Induction Type: IV induction Ventilation: Mask ventilation without difficulty LMA: LMA inserted LMA Size: 4.5 Number of attempts: 1 Placement Confirmation: positive ETCO2 and breath sounds checked- equal and bilateral Tube secured with: Tape Dental Injury: Teeth and Oropharynx as per pre-operative assessment

## 2021-05-31 NOTE — Op Note (Signed)
Operative note   Surgeon:Sokha Craker [employer] forces logistics/support/administrative officer: None    Preop diagnosis: Extensor hallucis longus laceration left foot    Postop diagnosis: Same    Procedure: Open repair of extensor hallucis longus tendon laceration    EBL: Minimal    Anesthesia:local and general local consisted of a total of 20 cc of Exparel long-acting anesthetic and 10 cc of 0.5% bupivacaine with epinephrine    Hemostasis: Thigh tourniquet inflated to 250 mmHg for approximately 55-minute    Specimen: None    Complications: None    Operative indications:Larry Austin is an 60 y.o. that presents today for surgical intervention.  The risks/benefits/alternatives/complications have been discussed and consent has been given.    Procedure:  Patient was brought into the OR and placed on the operating table in thesupine position. After anesthesia was obtained theleft lower extremity was prepped and draped in usual sterile fashion.  Attention was directed to the dorsal aspect of the left foot.  The laceration site was noted.  A longitudinal incision was performed just distal to the laceration site along the extensor hallucis longus tendon site.  At this time the distal portion of the extensor hallucis longus tendon was noted within the tendon sheath.  This was then freed.  A mild amount of scarring of the distal tendon sheath and tendon were noted at this time.  The incision was taken slightly proximal along the first metatarsal.  At this time the tendon sheath was noted.  Palpation of the dorsal midfoot was then undertaken.  At about the level of the midtarsal region I was able to palpate the proximal end of the extensor tendon.  A small incision was made dorsally along this area.  Sharp and blunt dissection carried down to the tendon sheath.  This was then entered and the proximal end of the tendon sheath was then noted.  Next a 2-0 Mersilene suture was placed in the distal portion of the tendon with a short Bennell  stitch.  A tendon passer was placed through the tendon sheath from the distal incision site and the proximal portion of the lacerated tendon was placed through the tendon sheath to the distal incision site.  A 2-0 Mersilene's suture was placed into the distal stump of the tendon and short Bennell stitch.  The toe was dorsiflexed and the foot dorsiflexed and I was able to reanastomose the 2 suture ends the toe was just slightly dorsiflexed after a stenosis in a nonweightbearing position but with the foot in a simulated weightbearing position in 90 degree the toe was nice and rectus.  The wound was flushed with copious amounts of irrigation.  Layered closure was then performed.  3-0 Vicryl the tendon sheaths and subcutaneous tissue and a 3-0 nylon for the skin.  The patient was placed at 90 degrees neutral position in a posterior splint.    Patient tolerated the procedure and anesthesia well.  Was transported from the OR to the PACU with all vital signs stable and vascular status intact. To be discharged per routine protocol.  Will follow up in approximately 1 week in the outpatient clinic.

## 2021-06-01 ENCOUNTER — Encounter: Payer: Self-pay | Admitting: Podiatry

## 2021-06-02 NOTE — Addendum Note (Signed)
Addendum  created 06/02/21 2317 by Lenard Simmer, MD   Clinical Note Signed

## 2021-06-02 NOTE — Anesthesia Postprocedure Evaluation (Deleted)
Anesthesia Post Note  Patient: Larry Austin  Procedure(s) Performed: REPAIR EXTENSOR TENDON (Left: Foot)  Patient location during evaluation: PACU Anesthesia Type: General Level of consciousness: awake and alert Pain management: pain level controlled Vital Signs Assessment: post-procedure vital signs reviewed and stable Respiratory status: spontaneous breathing, nonlabored ventilation, respiratory function stable and patient connected to nasal cannula oxygen Cardiovascular status: blood pressure returned to baseline and stable Postop Assessment: no apparent nausea or vomiting Anesthetic complications: no   No notable events documented.   Last Vitals:  Vitals:   05/31/21 1900 05/31/21 1915  BP: 122/82 136/87  Pulse: 74 68  Resp: 15 15  Temp:  (!) 36.4 C  SpO2: 100% 97%    Last Pain:  Vitals:   06/01/21 1409  PainSc: 1                  Lenard Simmer

## 2021-06-02 NOTE — Anesthesia Postprocedure Evaluation (Signed)
Anesthesia Post Note  Patient: Larry Austin  Procedure(s) Performed: REPAIR EXTENSOR TENDON (Left: Foot)  Patient location during evaluation: PACU Anesthesia Type: General Level of consciousness: awake and alert Pain management: pain level controlled Vital Signs Assessment: post-procedure vital signs reviewed and stable Respiratory status: spontaneous breathing, nonlabored ventilation, respiratory function stable and patient connected to nasal cannula oxygen Cardiovascular status: blood pressure returned to baseline and stable Postop Assessment: no apparent nausea or vomiting Anesthetic complications: no   No notable events documented.   Last Vitals:  Vitals:   05/31/21 1900 05/31/21 1915  BP: 122/82 136/87  Pulse: 74 68  Resp: 15 15  Temp:  (!) 36.4 C  SpO2: 100% 97%    Last Pain:  Vitals:   06/01/21 1409  PainSc: 1                  Andrika Peraza     

## 2021-06-02 NOTE — Addendum Note (Signed)
Addendum  created 06/02/21 2318 by Lenard Simmer, MD   Delete clinical note

## 2021-09-17 ENCOUNTER — Other Ambulatory Visit: Payer: Self-pay | Admitting: Podiatry

## 2021-09-17 ENCOUNTER — Other Ambulatory Visit: Payer: Self-pay

## 2021-09-17 ENCOUNTER — Ambulatory Visit
Admission: RE | Admit: 2021-09-17 | Discharge: 2021-09-17 | Disposition: A | Discharge status: Home / Self Care | Payer: BC Managed Care – PPO | Source: Ambulatory Visit | Attending: Podiatry | Admitting: Podiatry

## 2021-09-17 DIAGNOSIS — M79662 Pain in left lower leg: Secondary | ICD-10-CM | POA: Diagnosis present

## 2021-09-17 DIAGNOSIS — M7989 Other specified soft tissue disorders: Secondary | ICD-10-CM | POA: Insufficient documentation

## 2021-09-18 ENCOUNTER — Ambulatory Visit: Payer: BC Managed Care – PPO

## 2023-06-08 IMAGING — US US EXTREM LOW VENOUS*L*
1 series · 13 of 24 positions shown · non-contrast
Comparison: None.

CLINICAL DATA: Left lower extremity pain and swelling.



[Series 1: us venous img lower uni left (dvt) · portal-venous · 13 of 31 slices shown]
[im 1/31]
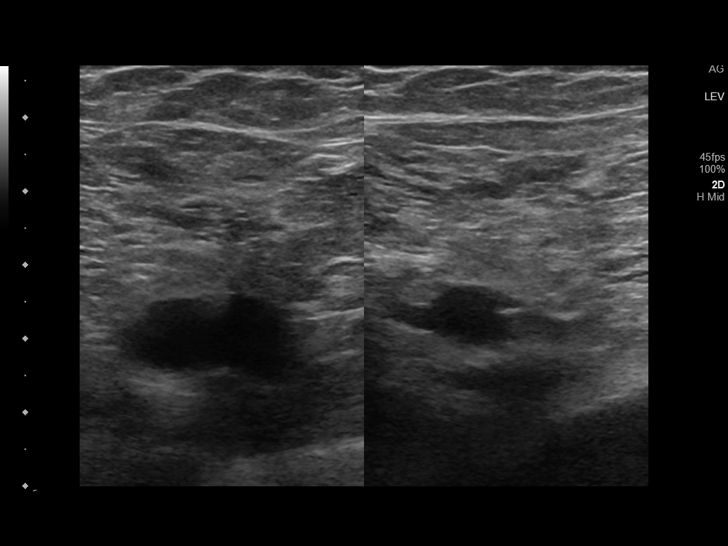
[im 3/31]
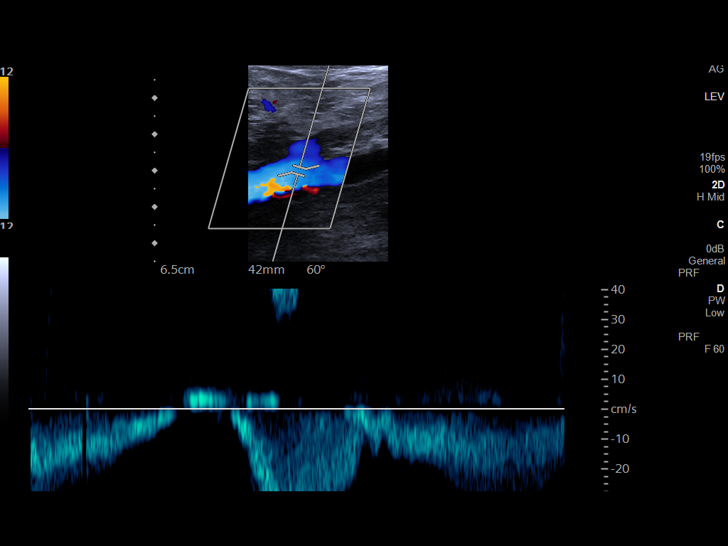
[im 6/31]
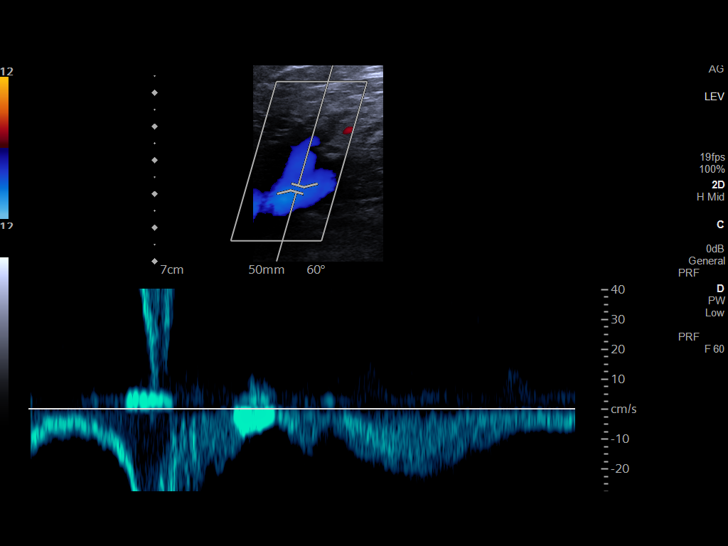
[im 8/31]
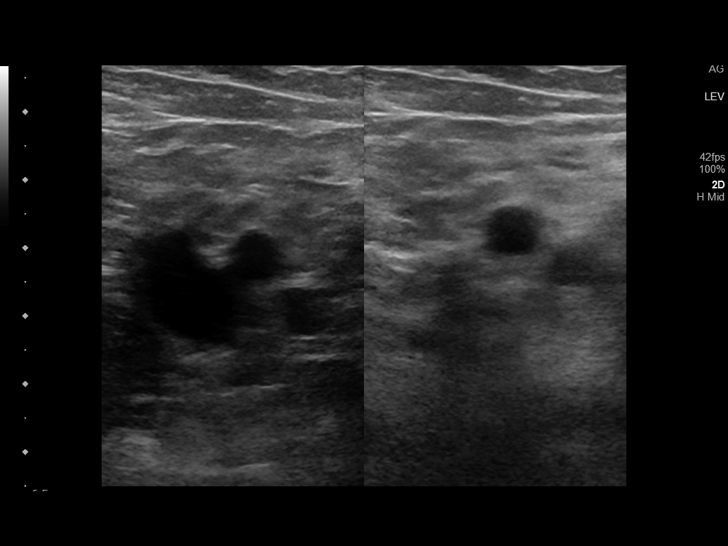
[im 11/31]
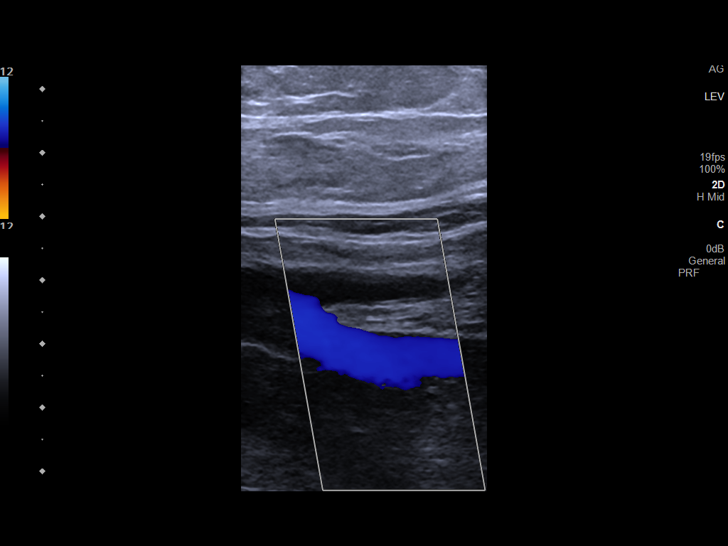
[im 14/31]
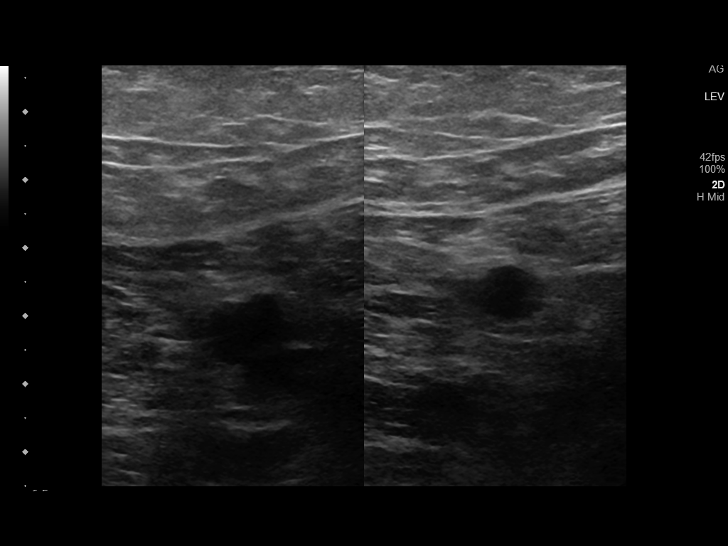
[im 16/31]
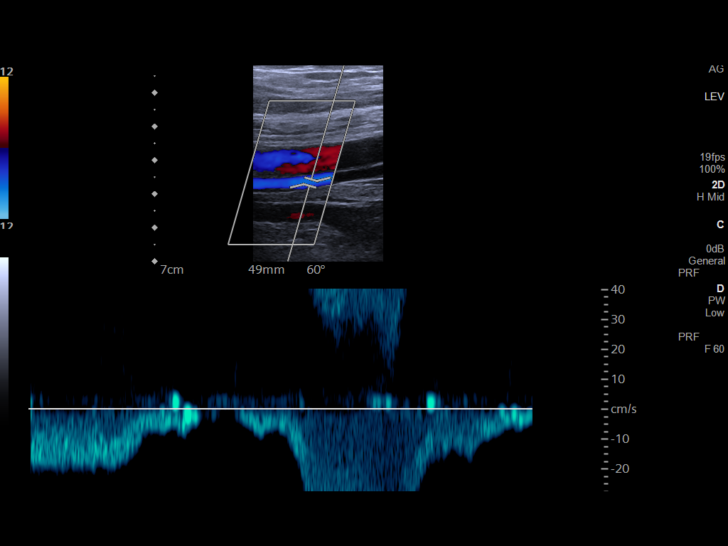
[im 17/31]
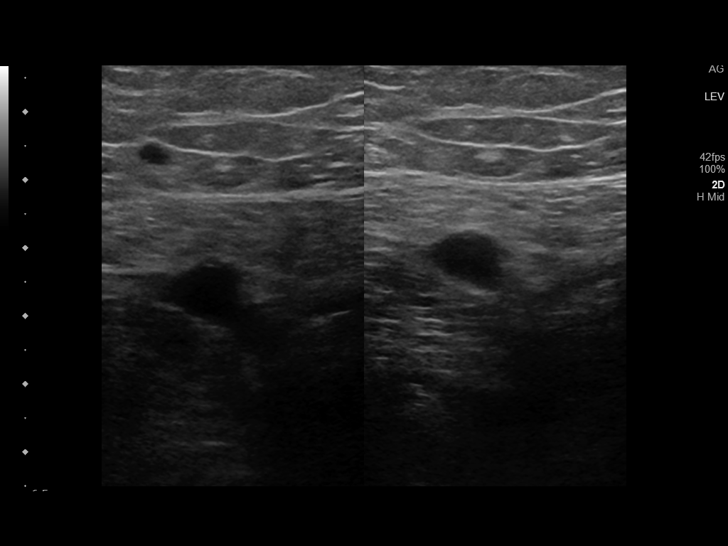
[im 20/31]
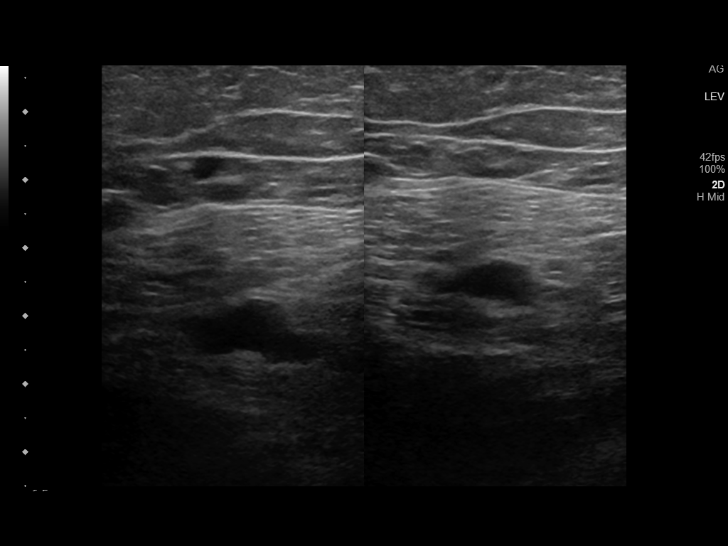
[im 23/31]
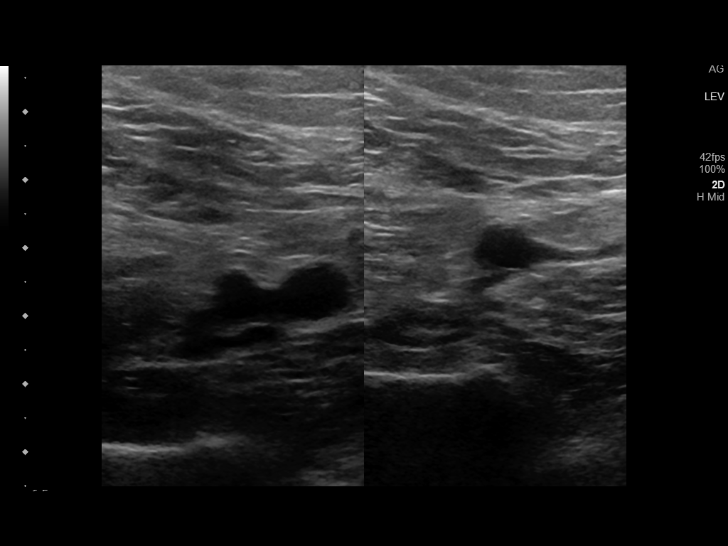
[im 25/31]
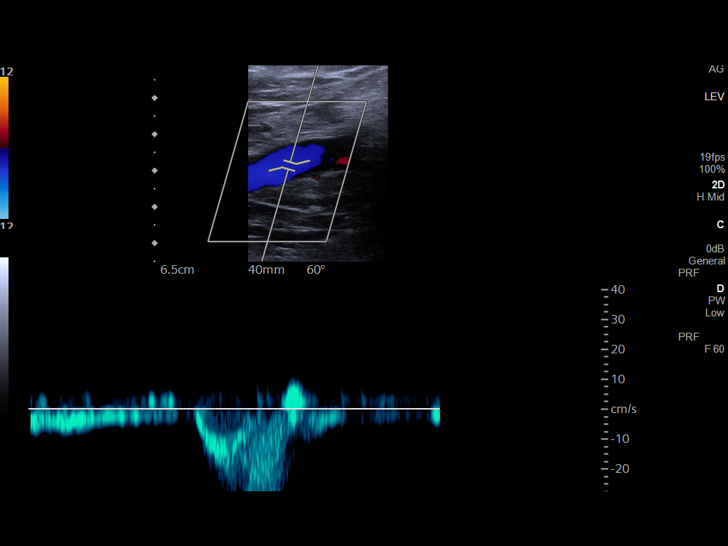
[im 28/31]
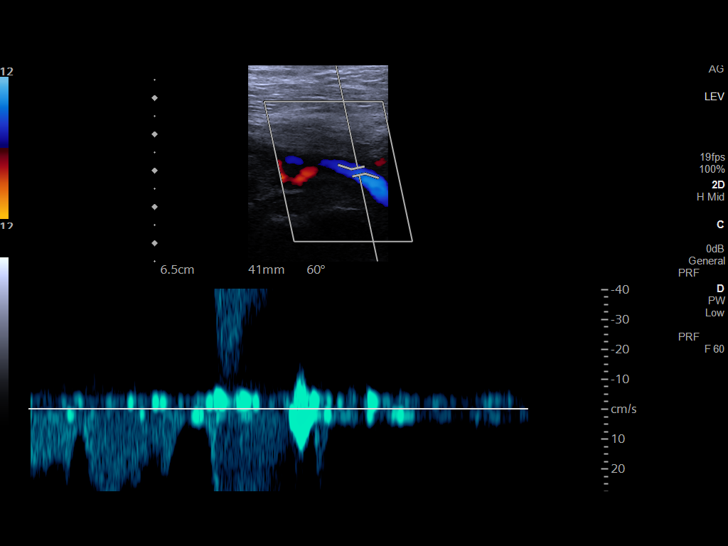
[im 31/31]
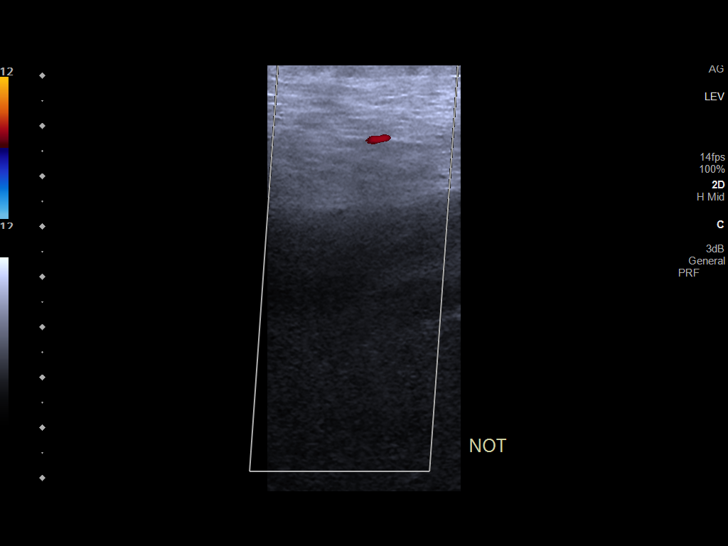

[13 of 24 positions shown; findings below may reference images not displayed]

FINDINGS: Contralateral Common Femoral Vein: Respiratory phasicity is normal
and symmetric with the symptomatic side. No evidence of thrombus.
Normal compressibility.

Common Femoral Vein: No evidence of thrombus. Normal
compressibility, respiratory phasicity and response to augmentation.

Saphenofemoral Junction: No evidence of thrombus. Normal
compressibility and flow on color Doppler imaging.

Profunda Femoral Vein: No evidence of thrombus. Normal
compressibility and flow on color Doppler imaging.

Femoral Vein: No evidence of thrombus. Normal compressibility,
respiratory phasicity and response to augmentation.

Popliteal Vein: No evidence of thrombus. Normal compressibility,
respiratory phasicity and response to augmentation.

Calf Veins: Visualized left deep calf veins are patent without
thrombus but limited evaluation.

Other Findings:  None.
IMPRESSION: Negative for deep venous thrombosis in left lower extremity. Limited
evaluation of the left deep calf veins.
